# Patient Record
Sex: Male | Born: 1965 | Race: White | Hispanic: No | Marital: Married | Smoking: Never smoker
Health system: Southern US, Community
[De-identification: ages and names within clinical notes are randomized; demographics above are authoritative.]

## PROBLEM LIST (undated history)

## (undated) DIAGNOSIS — R9389 Abnormal findings on diagnostic imaging of other specified body structures: Secondary | ICD-10-CM

## (undated) DIAGNOSIS — G4733 Obstructive sleep apnea (adult) (pediatric): Secondary | ICD-10-CM

## (undated) HISTORY — DX: Obstructive sleep apnea (adult) (pediatric): G47.33

## (undated) HISTORY — DX: Abnormal findings on diagnostic imaging of other specified body structures: R93.89

---

## 1999-07-06 ENCOUNTER — Encounter (INDEPENDENT_AMBULATORY_CARE_PROVIDER_SITE_OTHER): Payer: Self-pay | Admitting: Specialist

## 1999-07-06 ENCOUNTER — Other Ambulatory Visit: Admission: RE | Admit: 1999-07-06 | Discharge: 1999-07-06 | Payer: Self-pay | Admitting: *Deleted

## 2000-08-10 ENCOUNTER — Encounter: Payer: Self-pay | Admitting: Pulmonary Disease

## 2000-10-28 ENCOUNTER — Encounter: Payer: Self-pay | Admitting: Pulmonary Disease

## 2000-10-28 ENCOUNTER — Ambulatory Visit (HOSPITAL_BASED_OUTPATIENT_CLINIC_OR_DEPARTMENT_OTHER): Admission: RE | Admit: 2000-10-28 | Discharge: 2000-10-28 | Payer: Self-pay | Admitting: Pulmonary Disease

## 2004-03-21 ENCOUNTER — Ambulatory Visit: Payer: Self-pay | Admitting: Pulmonary Disease

## 2004-04-21 ENCOUNTER — Ambulatory Visit: Payer: Self-pay | Admitting: Family Medicine

## 2004-09-01 ENCOUNTER — Ambulatory Visit: Payer: Self-pay | Admitting: Family Medicine

## 2005-04-24 ENCOUNTER — Ambulatory Visit: Payer: Self-pay | Admitting: Pulmonary Disease

## 2006-08-15 ENCOUNTER — Ambulatory Visit: Payer: Self-pay | Admitting: Pulmonary Disease

## 2007-07-03 ENCOUNTER — Encounter: Payer: Self-pay | Admitting: Pulmonary Disease

## 2007-07-10 DIAGNOSIS — Z9989 Dependence on other enabling machines and devices: Secondary | ICD-10-CM

## 2007-07-10 DIAGNOSIS — G4733 Obstructive sleep apnea (adult) (pediatric): Secondary | ICD-10-CM | POA: Insufficient documentation

## 2008-08-21 ENCOUNTER — Ambulatory Visit: Payer: Self-pay | Admitting: Pulmonary Disease

## 2009-06-23 ENCOUNTER — Encounter: Payer: Self-pay | Admitting: Pulmonary Disease

## 2009-08-24 ENCOUNTER — Encounter: Admission: RE | Admit: 2009-08-24 | Discharge: 2009-08-24 | Payer: Self-pay | Admitting: Cardiology

## 2009-08-24 ENCOUNTER — Encounter: Payer: Self-pay | Admitting: Internal Medicine

## 2009-08-25 ENCOUNTER — Ambulatory Visit: Payer: Self-pay | Admitting: Internal Medicine

## 2009-08-25 DIAGNOSIS — R930 Abnormal findings on diagnostic imaging of skull and head, not elsewhere classified: Secondary | ICD-10-CM

## 2009-08-25 DIAGNOSIS — R0609 Other forms of dyspnea: Secondary | ICD-10-CM | POA: Insufficient documentation

## 2009-08-25 DIAGNOSIS — R0989 Other specified symptoms and signs involving the circulatory and respiratory systems: Secondary | ICD-10-CM

## 2009-09-08 ENCOUNTER — Ambulatory Visit: Payer: Self-pay | Admitting: Internal Medicine

## 2009-09-08 ENCOUNTER — Encounter: Payer: Self-pay | Admitting: Internal Medicine

## 2009-10-22 ENCOUNTER — Ambulatory Visit: Payer: Self-pay | Admitting: Pulmonary Disease

## 2009-10-22 DIAGNOSIS — R05 Cough: Secondary | ICD-10-CM

## 2009-11-03 ENCOUNTER — Encounter: Payer: Self-pay | Admitting: Pulmonary Disease

## 2009-11-11 ENCOUNTER — Telehealth: Payer: Self-pay | Admitting: Internal Medicine

## 2009-11-18 ENCOUNTER — Telehealth (INDEPENDENT_AMBULATORY_CARE_PROVIDER_SITE_OTHER): Payer: Self-pay | Admitting: *Deleted

## 2009-11-23 ENCOUNTER — Encounter: Payer: Self-pay | Admitting: Internal Medicine

## 2009-11-24 ENCOUNTER — Telehealth (INDEPENDENT_AMBULATORY_CARE_PROVIDER_SITE_OTHER): Payer: Self-pay | Admitting: *Deleted

## 2009-12-07 ENCOUNTER — Encounter: Payer: Self-pay | Admitting: Internal Medicine

## 2009-12-08 ENCOUNTER — Ambulatory Visit: Payer: Self-pay | Admitting: Internal Medicine

## 2009-12-08 ENCOUNTER — Encounter: Payer: Self-pay | Admitting: Internal Medicine

## 2010-02-24 NOTE — Medication Information (Signed)
Summary: [facility]  Denial/medco   Imported By: Lester Berrydale 12/03/2009 08:09:00  _____________________________________________________________________  External Attachment:    Type:   Image     Comment:   External Document

## 2010-02-24 NOTE — Miscellaneous (Signed)
Summary: Orders Update  Clinical Lists Changes  Orders: Added new Test order of T-2 View CXR (71020TC) - Signed 

## 2010-02-24 NOTE — Progress Notes (Signed)
Summary: prescription for dexilant  Phone Note Call from Patient Call back at Home Phone [phone]   Caller: Patient Call For: clance Summary of Call: Wants a rx for dexilant 60mg .//[facility] Initial call taken by: Darletta Moll,  November 11, 2009 3:47 PM  Follow-up for Phone Call        pt was recently seen by [facility] 10/22/2009 and per instructions were to stop pepcid and start on dexilant two times a day.  pt was to f/u in 1 year for his OSA but to call in 2 weeks with an update on his cough.  [id number]  Aundra Millet Reynolds LPN  November 11, 2009 4:12 PM   called and spoke with pt.  pt states KC had given him samples of Dexilant to take two times a day for cough, gerd and throat clearing.  Pt states while he was on med his cough resolved and he denied any throat clearning and states gerd had improved.  pt states he has now ran out of samples and symptoms are returning.  Pt requests an rx be sent to his local pharmacy.  KC out of the office today.  Will forward message to doc of the day to address.  Cy, please advise if ok to send rx for Dexilant and if ok, please advise of Sig... do you still want him on two times a day or change to just once daily.  Thank you.  Aundra Millet Reynolds LPN  November 11, 2009 4:31 PM   Additional Follow-up for Phone Call Additional follow up Details #1::        Per CDY- ok to script #60 with 5 refills.Reynaldo Minium CMA  November 12, 2009 2:06 PM   Rx sent. pt aware.Carron Curie CMA  November 12, 2009 2:52 PM     Prescriptions: DEXILANT 60 MG CPDR (DEXLANSOPRAZOLE) Take  one 30-60 min before first meal of the day  #60 x 5   Entered by:   Carron Curie CMA   Authorized by:   Waymon Budge MD   Signed by:   Carron Curie CMA on 11/12/2009   Method used:   [facility]  [address] [phone][facility] (retail[facility]       [address]       [place], [place]  [address]       Ph: [phone] or [phone]       Fax: [phone]   RxID:    [id number]

## 2010-02-24 NOTE — Letter (Signed)
Summary: [facility]  [facility]   Imported By: Lester [place] 06/29/2009 11:57:45  _____________________________________________________________________  External Attachment:    Type:   Image     Comment:   External Document

## 2010-02-24 NOTE — Progress Notes (Signed)
Summary: f/u with cxr sched  ---- Converted from flag ---- ---- 09/08/2009 1:40 PM, Nyoka Cowden MD wrote: f/u cxr by now due ------------------------------  Spoke with pt and sched appt for 12/08/09 at 11:55 am Frank Boyle  November 25, 2009 2:57 PM

## 2010-02-24 NOTE — Assessment & Plan Note (Signed)
Summary: Pulmonary/  summary f/u ov for cough and f/u cxr = wnl   Copy to:  Dr. Julieanne Manson Primary Provider/Referring Provider:  Dr. Watt Climes  CC:  Cough- much improved.  History of Present Illness: 45 yowm never smoker with asthma as child reported by his mother (not recalled by pt)  variable sob  x 2003  August 25, 2009  1st pulmonary office eval  with variable sob sometimes at rest, assoc with freq throat clearing, sometimes can last up to a week and not necessarily affecting ability to do yardwork or swim or affect him once asleep and assoc with sensaiton needs to take a deep breath. started on asthma rx 8/2 and maybe some better but still having obvious resting sob in office with freq throat clearing and harsh barking dry cough.  imp ? vcd/ pseudoasthma rec  stop symbicort proaire up to 2 puffs if short of breath tramadol up to 2 every 4 hours for cough or throat clearing dexilant 60 mg Take  one 30-60 min before first meal of the day  pepcid 20 mg at bedtime taper off prednsione complete 09/06/09  September 08, 2009  ov overall night and day improved vs previous ov but still  throat clearing.  ok sleeping , walking, umpiring using proaire maybe once daily when heavy ex. rec treat gerd dexilant and pepcid  worked well until change to nexium not taking consistently 30-60 ac and stopped pepcid at hs when changed to nexium ? why?   December 08, 2009 cc cough better but still there p big meal and first thing in am. overall much better, no excess am or noct sputum production. Pt denies any significant sore throat, dysphagia, itching, sneezing,  nasal congestion or excess secretions,  fever, chills, sweats, unintended wt loss, pleuritic or exertional cp, hempoptysis, change in activity tolerance  orthopnea pnd or leg swelling Pt also denies any obvious fluctuation in symptoms with weather or environmental change or other alleviating or aggravating factors.       Current Medications  (verified): 1)  Potassium .... Take 1 Tablet By Mouth Once A Day 2)  Multivitamins  Caps (Multiple Vitamin) .... Take 1 Tablet By Mouth Once A Day 3)  Onglyza 5 Mg Tabs (Saxagliptin Hcl) .... Take 1 Tablet By Mouth Once A Day 4)  Metformin Hcl 500 Mg Tabs (Metformin Hcl) .... Take 1 Tablet By Mouth Two Times A Day 5)  Nabumetone 750 Mg Tabs (Nabumetone) .... Take 1 Tablet By Mouth Two Times A Day 6)  Zolpidem Tartrate 10 Mg Tabs (Zolpidem Tartrate) .Marland Kitchen.. 1 At Bedtime As Needed 7)  Proair Hfa 108 (90 Base) Mcg/act Aers (Albuterol Sulfate) .... 2 Puffs Every 4 Hours If Needed 8)  Nexium 40 Mg Cpdr (Esomeprazole Magnesium) .Marland Kitchen.. 1 By Mouth 30 To 60 Min Before First Meal of The Day. 9)  Pepcid Ac Maximum Strength 20 Mg Tabs (Famotidine) .... One At Bedtime- Stopped Taking  Allergies (verified): No Known Drug Allergies  Past History:  Past Medical History: OBSTRUCTIVE SLEEP APNEA (ICD-327.23).........................Marland KitchenClance Dyspea ? etiology         Unexplained sob vcd/ gerd vs panic disorder       - PFT's:  Nl spirometry September 08, 2009       - HFA 50 > 90% September 08, 2009  Abn CXR       - Mediastinal contour irregular 08/24/09 > not obvious on f/u cxr December 08, 2009   Vital Signs:  Patient profile:  45 year old male Weight:      290 pounds O2 Sat:      93 % on Room air Temp:     98.1 degrees F oral Pulse rate:   84 / minute BP sitting:   104 / 70  (left arm)  Vitals Entered By: Vernie Murders (December 08, 2009 11:53 AM)  O2 Flow:  Room air  Physical Exam  Additional Exam:  amb obese wm with much less  freq throat clearing  wt 305 > 297 August 25, 2009 > 303 September 08, 2009 > 290 December 08, 2009  HEENT: nl dentition, turbinates, and orophanx. Nl external ear canals without cough reflex Neck without JVD/Nodes/TM Lungs clear to A and P bilaterally without cough on insp or exp maneuvers RRR no s3 or murmur or increase in P2 Abd soft and benign with nl excursion in the supine  position. No bruits or organomegaly Ext warm without calf tenderness, cyanosis clubbing or edema Skin warm and dry without lesions      CXR  Procedure date:  12/08/2009  Findings:        Comparison: 08/24/2009   Findings: Trachea is midline.  Heart size normal.  Lungs are clear. No pleural fluid.  Prominent osteophytes are seen in the spine.   IMPRESSION: No acute findings.   Impression & Recommendations:  Problem # 1:  COUGH (ICD-786.2)   Classic Upper airway cough syndrome, so named because it's frequently impossible to sort out how much is  CR/sinusitis with freq throat clearing (which can be related to primary GERD)   vs  causing  secondary extra esophageal GERD from wide swings in gastric pressure that occur with throat clearing, promoting self use of mint and menthol lozenges that reduce the lower esophageal sphincter tone and exacerbate the problem further These are the same pts who not infrequently have failed to tolerate ace inhibitors,  dry powder inhalers or biphosphonates or report having reflux symptoms that don't respond to standard doses of PPI   For now continue max gerd rx  Orders: Est. Patient Level III (04540)  Problem # 2:  ABNORMAL LUNG XRAY (ICD-793.1)  I don't see any obvious med problem on pa and lateral and radiology read is also nl so no further xrays needed  Orders: Est. Patient Level III (98119)  Medications Added to Medication List This Visit: 1)  Pepcid Ac Maximum Strength 20 Mg Tabs (Famotidine) .... One at bedtime 2)  Pepcid Ac Maximum Strength 20 Mg Tabs (Famotidine) .... One at bedtime- stopped taking  Patient Instructions: 1)  Keep follow up with Dr Shelle Iron unless the cough is still bothering you on the  Nexium Take  one 30-60 min before first meal of the day and the pepcid 20 mg one at bedtime 2)  we will call with your xray report

## 2010-02-24 NOTE — Assessment & Plan Note (Signed)
Summary: 2 weeks/ mbw   Current Medications (verified): 1)  Potassium .... Take 1 Tablet By Mouth Once A Day 2)  Multivitamins  Caps (Multiple Vitamin) .... Take 1 Tablet By Mouth Once A Day 3)  Onglyza 5 Mg Tabs (Saxagliptin Hcl) .... Take 1 Tablet By Mouth Once A Day 4)  Metformin Hcl 500 Mg Tabs (Metformin Hcl) .... Take 1 Tablet By Mouth Two Times A Day 5)  Nabumetone 750 Mg Tabs (Nabumetone) .... Take 1 Tablet By Mouth Two Times A Day 6)  Zolpidem Tartrate 10 Mg Tabs (Zolpidem Tartrate) .Marland Kitchen.. 1 At Bedtime As Needed 7)  Proair Hfa 108 (90 Base) Mcg/act Aers (Albuterol Sulfate) .... 2 Puffs Every 4 Hours If Needed 8)  Dexilant 60 Mg Cpdr (Dexlansoprazole) .... Take  One 30-60 Min Before First Meal of The Day 9)  Pepcid Ac Maximum Strength 20 Mg Tabs (Famotidine) .... One At Bedtime  Allergies (verified): No Known Drug Allergies  Vital Signs:  Patient profile:   45 year old male Height:      182.88 cm Weight:      0.50 pounds (137.95 kg) BMI:     41.31 Temp:     36.94 degrees C   THIS WAS PUT IN ON THE WRONG VISIT # DUE TO THE DOCTORS CHANGING THERE MINDS ON WHO WAS SEEING THE PATIENT SEE OTHER NOTE FOR CHARGE AND FULL VISIT.Marland KitchenChantel Bowne  September 08, 2009 12:26 PM

## 2010-02-24 NOTE — Miscellaneous (Signed)
  Clinical Lists Changes 

## 2010-02-24 NOTE — Assessment & Plan Note (Signed)
Summary: rov for osa, chronic cough   Copy to:  Dr. Julieanne Manson Primary Provider/Referring Provider:  Dr. Watt Climes  CC:  4 week follow up. pt states he currently uses his cpap 7/7 nights x 6-7 hrs a night. Pt states he is having some problems with his mask and pt states he can't get in touch with anyone form his homecare company. .  History of Present Illness: the pt comes in today for f/u of his osa, but is also having acute issues with persistent cough.  The pt is due for a mask, but has been unable to get in touch with his dme.  He is unhappy with them as well since they never got a download off his machine while on auto, and he has stayed on that mode.  He would prefer to be on a set pressure.  The pt also has been having issues with upper airway cough, and was recently seen by Dr. Sherene Sires.  He has been trying behavioral therapy, but continues to have his dry cough.  He has been doing a lot of throat clearing, and has a classic globus sensation.  He is having burping per his wife, and breakthru reflux symptoms despite dexilant and pepcid at hs.  He is convinced that food is getting stuck in his throat.  Current Medications (verified): 1)  Potassium .... Take 1 Tablet By Mouth Once A Day 2)  Multivitamins  Caps (Multiple Vitamin) .... Take 1 Tablet By Mouth Once A Day 3)  Onglyza 5 Mg Tabs (Saxagliptin Hcl) .... Take 1 Tablet By Mouth Once A Day 4)  Metformin Hcl 500 Mg Tabs (Metformin Hcl) .... Take 1 Tablet By Mouth Two Times A Day 5)  Nabumetone 750 Mg Tabs (Nabumetone) .... Take 1 Tablet By Mouth Two Times A Day 6)  Zolpidem Tartrate 10 Mg Tabs (Zolpidem Tartrate) .Marland Kitchen.. 1 At Bedtime As Needed 7)  Proair Hfa 108 (90 Base) Mcg/act Aers (Albuterol Sulfate) .... 2 Puffs Every 4 Hours If Needed 8)  Dexilant 60 Mg Cpdr (Dexlansoprazole) .... Take  One 30-60 Min Before First Meal of The Day 9)  Pepcid Ac Maximum Strength 20 Mg Tabs (Famotidine) .... One At Bedtime  Allergies (verified): No Known  Drug Allergies  Past History:  Past medical, surgical, family and social histories (including risk factors) reviewed, and no changes noted (except as noted below).  Past Medical History: Reviewed history from 09/08/2009 and no changes required. OBSTRUCTIVE SLEEP APNEA (ICD-327.23).........................Marland KitchenClance Dyspea ? etiology         Unexplained sob vcd/ gerd vs panic disorder       - PFT's:  Nl spirometry September 08, 2009       - HFA 50 > 90% September 08, 2009  Abn CXR       - Mediastinal contour irregular 08/24/09  Family History: Reviewed history from 08/25/2009 and no changes required. heart disease: father Negative for respiratory diseases or atopy   Social History: Reviewed history from 08/25/2009 and no changes required. Patient never smoked.  Pt works as an Personnel officer. Married Children No ETOH  Review of Systems       The patient complains of shortness of breath with activity, non-productive cough, difficulty swallowing, hand/feet swelling, and joint stiffness or pain.  The patient denies shortness of breath at rest, productive cough, coughing up blood, chest pain, irregular heartbeats, acid heartburn, indigestion, loss of appetite, weight change, abdominal pain, sore throat, tooth/dental problems, headaches, nasal congestion/difficulty breathing through nose, sneezing, itching, ear  ache, anxiety, depression, rash, change in color of mucus, and fever.    Vital Signs:  Patient profile:   45 year old male Height:      72 inches Weight:      295.25 pounds BMI:     40.19 O2 Sat:      94 % on Room air Temp:     99.0 degrees F oral Pulse rate:   93 / minute BP sitting:   150 / 88  (right arm) Cuff size:   large  Vitals Entered By: Carver Fila (October 22, 2009 3:45 PM)  O2 Flow:  Room air CC: 4 week follow up. pt states he currently uses his cpap 7/7 nights x 6-7 hrs a night. Pt states he is having some problems with his mask and pt states he can't get in touch  with anyone form his homecare company.  Comments meds and allergies updated Phone number updated Carver Fila  October 22, 2009 3:45 PM    Physical Exam  General:  obese male in nad Nose:  no skin breakdown or pressure necrosis from cpap mask Lungs:  clear to auscultation Heart:  rrr, no mrg Extremities:  no edema or cyanosis  Neurologic:  alert, does not appear sleepy, moves all 4.   Impression & Recommendations:  Problem # 1:  OBSTRUCTIVE SLEEP APNEA (ICD-327.23) the pt has osa that is being treated with cpap.  He is doing well with the new machine, but needs a new mask and his machine set on a fixed pressure.  Will refer him to a different dme, and get a download off his machine to set his pressure.  Will also get new supplies and mask.  I have encouraged him to work aggressively on weight loss.  Problem # 2:  COUGH (ICD-786.2) the pt's cough is most c/w an upper airway issue, and most likely is due to GERD given his GI symptoms and a cyclical cough mechanism with constant throat clearing.  I would like to double up on his ppi, and have asked him to stay on the behavioral therapies.  He may need a GI evaluation if symptoms continue.  Other Orders: Est. Patient Level IV (98119) DME Referral (DME)  Patient Instructions: 1)  stop pepcid.  take dexilant in am and pm 2)  take chlorpheniramine 8mg  at bedtime each night for next 2 weeks. 3)  stop proair. 4)  no singing, yelling, minimize voice use, no throat clearing. 5)  will get download off your machine and change to set pressure 6)  will arrange for new mask 7)  work on weight loss 8)  followup with me in one year for your sleep apnea, but call me in 2 weeks with progress on your cough   Immunization History:  Influenza Immunization History:    Influenza:  historical (10/23/2008)   Appended Document: rov for osa, chronic cough re have received cpap download and put into you important lok at papers  Appended Document:  rov for osa, chronic cough cpap pressure needs to be 15cm.  will send an order to [facility].

## 2010-02-24 NOTE — Progress Notes (Signed)
Summary: Dexilant PA initiated  Phone Note Outgoing Call   Call placed by: Michel Bickers CMA,  November 18, 2009 10:53 AM Call placed to: [facility] [phone] Summary of Call: PA initiated for Dexilant 60MG . Case # is [id number]. Awaiting fax. Initial call taken by: Michel Bickers CMA,  November 18, 2009 10:57 AM  Follow-up for Phone Call        Form received and placed in MW's to do pile.   Gweneth Dimitri RN  November 18, 2009 4:41 PM  Dexilant has been denied. Please advise on an alternative.Carron Curie CMA  November 25, 2009 12:06 PM  Per MW okay to switch to nexium- I already had faxed this to pharm last wk, it must have not went through so I sent it to pharm. Follow-up by: Vernie Murders,  November 25, 2009 2:59 PM    New/Updated Medications: NEXIUM 40 MG CPDR (ESOMEPRAZOLE MAGNESIUM) 1 by mouth 30 to 60 min before first meal of the day. Prescriptions: NEXIUM 40 MG CPDR (ESOMEPRAZOLE MAGNESIUM) 1 by mouth 30 to 60 min before first meal of the day.  #30 x 5   Entered by:   Vernie Murders   Authorized by:   Nyoka Cowden MD   Signed by:   Vernie Murders on 11/25/2009   Method used:   [facility]  [address] [phone]* (retail[facility]       [address]       [place], [place]  [address]       Ph: [phone] or [phone]       Fax: [phone]   RxID:   [id number]

## 2010-02-24 NOTE — Assessment & Plan Note (Signed)
Summary: Pulmonary/ ext ov with hfa teaching at 90 % p coaching   Visit Type:  Follow-up Copy to:  Dr. Julieanne Manson Primary Provider/Referring Provider:  Dr. Watt Climes  CC:  Patient here for 2 week follow-up.  The patient says his cough and sob have improved.Marland Kitchen  History of Present Illness: 1 yowm never smoker with asthma as child reported by his mother (not recalled by pt)  variable sob  x 2003  August 25, 2009  1st pulmonary office eval  with variable sob sometimes at rest, assoc with freq throat clearing, sometimes can last up to a week and not necessarily affecting ability to do yardwork or swim or affect him once asleep and assoc with sensaiton needs to take a deep breath. started on asthma rx 8/2 and maybe some better but still having obvious resting sob in office with freq throat clearing and harsh barking dry cough.  imp ? vcd/ pseudoasthma rec  stop symbicort proaire up to 2 puffs if short of breath tramadol up to 2 every 4 hours for cough or throat clearing dexilant 60 mg Take  one 30-60 min before first meal of the day  pepcid 20 mg at bedtime taper off prednsione complete 09/06/09  September 08, 2009  ov overall night and day improved vs previous ov but still  throat clearing.  ok sleeping , walking, umpiring using proaire maybe once daily when heavy ex. Pt denies any significant sore throat, dysphagia, itching, sneezing,  nasal congestion or excess secretions,  fever, chills, sweats, unintended wt loss, pleuritic or exertional cp, hempoptysis, change in activity tolerance  orthopnea pnd or leg swelling.  Pt also denies any obvious fluctuation in symptoms with weather or environmental change or other alleviating or aggravating factors.     Pt denies any  rescue therapy x one week, denies waking up needing it or having early am exacerbations of coughing/wheezing/ or dyspnea.       Current Medications (verified): 1)  Potassium .... Take 1 Tablet By Mouth Once A Day 2)  Multivitamins   Caps (Multiple Vitamin) .... Take 1 Tablet By Mouth Once A Day 3)  Onglyza 5 Mg Tabs (Saxagliptin Hcl) .... Take 1 Tablet By Mouth Once A Day 4)  Metformin Hcl 500 Mg Tabs (Metformin Hcl) .... Take 1 Tablet By Mouth Two Times A Day 5)  Nabumetone 750 Mg Tabs (Nabumetone) .... Take 1 Tablet By Mouth Two Times A Day 6)  Tramadol Hcl 50 Mg Tabs (Tramadol Hcl) .Marland Kitchen.. 1-2 Every 4 Hours 7)  Zolpidem Tartrate 10 Mg Tabs (Zolpidem Tartrate) .Marland Kitchen.. 1 At Bedtime As Needed 8)  Proair Hfa 108 (90 Base) Mcg/act Aers (Albuterol Sulfate) .... 2 Puffs Every 4 Hours If Needed 9)  Prednisone 10 Mg Tabs (Prednisone) .... Tapered Dose As Directed 10)  Dexilant 60 Mg Cpdr (Dexlansoprazole) .... Take  One 30-60 Min Before First Meal of The Day 11)  Pepcid Ac Maximum Strength 20 Mg Tabs (Famotidine) .... One At Bedtime  Allergies (verified): No Known Drug Allergies  Past History:  Past Medical History: OBSTRUCTIVE SLEEP APNEA (ICD-327.23).........................Marland KitchenClance Dyspea ? etiology         Unexplained sob vcd/ gerd vs panic disorder       - PFT's:  Nl spirometry September 08, 2009       - HFA 50 > 90% September 08, 2009  Abn CXR       - Mediastinal contour irregular 08/24/09  Vital Signs:  Patient profile:   45 year  old male Height:      72 inches (182.88 cm) Weight:      303.50 pounds (137.95 kg) BMI:     41.31 O2 Sat:      97 % on Room air Temp:     98 degrees F (36.67 degrees C) oral Pulse rate:   86 / minute BP sitting:   120 / 80  (left arm) Cuff size:   large  Vitals Entered By: Michel Bickers CMA (September 08, 2009 12:04 PM)  O2 Sat at Rest %:  97 O2 Flow:  Room air CC: Patient here for 2 week follow-up.  The patient says his cough and sob have improved. Comments Medications reviewed with the patient. Daytime phone verified. Michel Bickers [facility]  September 08, 2009 12:05 PM   Physical Exam  Additional Exam:  amb obese wm with freq throat clearing and obvious sigh breathing wt 305 > 297 August 25, 2009  > 303 September 08, 2009  HEENT: nl dentition, turbinates, and orophanx. Nl external ear canals without cough reflex Neck without JVD/Nodes/TM Lungs clear to A and P bilaterally without cough on insp or exp maneuvers RRR no s3 or murmur or increase in P2 Abd soft and benign with nl excursion in the supine position. No bruits or organomegaly Ext warm without calf tenderness, cyanosis clubbing or edema Skin warm and dry without lesions      Pulmonary Function Test Date: 09/08/2009 12:01 AM Gender: Male  Pre-Spirometry FVC    Value: 4.50 L/min   % Pred: 82.30 % FEV1    Value: 3.24 L     Pred: 4.29 L     % Pred: 75.50 % FEV1/FVC  Value: 72.01 %     % Pred: 91.40 %  Impression & Recommendations:  Problem # 1:  DYSPNEA (ICD-786.09)  His updated medication list for this problem includes:    Proair Hfa 108 (90 Base) Mcg/act Aers (Albuterol sulfate) .Marland Kitchen... 2 puffs every 4 hours if needed  If has asthma at all, All goals of asthma met including optimal function and elimination of symptoms with minimum need for rescue therapy. Contingencies discussed today including the rule of two's.   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 These are the same pts who not infrequently have failed to tolerate ace inhibitors,  dry powder inhalers or biphosphonates or report having reflux symptoms that don't respond to standard doses of PPI   For now continue max gerd rx and eliminate cycylical coughing wtih tramadol  Orders: Est. Patient Level IV (08657) Spirometry w/Graph (94010)  Problem # 2:  ABNORMAL LUNG  XRAY (ICD-793.1)  Placed in tickle file for f/u cxr 11/23/2009 but consider CT chest in meantime if atypical symptoms continue  no baseline cxr in [facility] system ? old films anywhere?  Patient Instructions: 1)  If your breathing worsens or you need to use your rescue inhaler more than twice weekly or wake up more than twice a month with any respiratory symptoms or require more than two rescue inhalers per year, we need to see you right away. 2)  Take  delsym two tsp every 12 hours and add tramadol 50 mg up to every 4 hours to suppress the urge to cough. Swallowing water or using ice chips/non mint and menthol containing candies (such as lifesavers or sugarless jolly ranchers) are also effective.  3)  GERD (REFLUX)  is a common cause of respiratory symptoms. It commonly presents without heartburn and can be treated with medication, but also with lifestyle changes including avoidance of late meals, excessive alcohol, smoking cessation, and avoid fatty foods, chocolate, peppermint, colas, red wine, and acidic juices such as orange juice. NO MINT OR MENTHOL PRODUCTS SO NO COUGH DROPS  4)  USE SUGARLESS CANDY INSTEAD (jolley ranchers)  5)  NO OIL BASED VITAMINS  6)  Please schedule a follow-up appointment in 4 weeks with Dr Shelle Iron   [facility] Spirometry  ID: [id number] Patient: Frank Boyle, Frank Boyle DOB: 03/11/1965 Age: 45 Years Old Sex: Male Race: White Height: 72 Weight: 303.50 Status: Unconfirmed Past Medical History:    OBSTRUCTIVE SLEEP APNEA (ICD-327.23) Unexplained sob vcd/ gerd vs panic disorder  Recorded: 09/08/2009 12:01 AM  Parameter  Measured Predicted %Predicted FVC     4.50        5.47        82.30 FEV1     3.24        4.29        75.50 FEV1%   72.01        78.77        91.40 PEF    5.31        10.41        51   Interpretation:

## 2010-02-24 NOTE — Miscellaneous (Signed)
Summary: optimal pressure 15cm.  Clinical Lists Changes  Orders: Added new Referral order of DME Referral (DME) - Signed

## 2010-02-24 NOTE — Letter (Signed)
Summary: [employer]  [address]   [place], [place] [address]   Phone: [phone]  Fax: [phone]    Today's Date: December 08, 2009  Name of Patient: Frank Boyle  The above named patient had a medical visit today at: 11:55  am.  Please take this into consideration when reviewing the time away from work/school.    Special Instructions:  [ x ] None  [  ] To be off the remainder of today, returning to the normal work / school schedule tomorrow.  [  ] To be off until the next scheduled appointment on ______________________.  [  ] Other ________________________________________________________________ ________________________________________________________________________   Sincerely yours,     Sandrea Hughs, MD

## 2010-02-24 NOTE — Assessment & Plan Note (Signed)
Summary: Pulmonary consultation/ eval cough and sob ? all GERD?   Visit Type:  Initial Consult Copy to:  Dr. Julieanne Manson Primary Provider/Referring Provider:  Dr. Watt Climes  CC:  Dyspnea.  History of Present Illness: 69 yowm never smoker with asthma as child reported by his mother (not recalled by pt)  variable sob  x 2003  August 25, 2009  1st pulmonary office eval  with variable sob sometimes at rest, assoc with freq throat clearing, sometimes can last up to a week and not necessarily affecting ability to do yardwork or swim or affect him once asleep and assoc with sensaiton needs to take a deep breath. started on asthma rx 8/2 and maybe some better but still having obvious resting sob in office with freq throat clearing and harsh barking dry cough.  Pt denies any significant sore throat, dysphagia, itching, sneezing,  nasal congestion or excess secretions,  fever, chills, sweats, unintended wt loss, pleuritic or exertional cp, hempoptysis, change in activity tolerance  orthopnea pnd or leg swelling.  h/o hb ok on ppi.  Pt also denies any obvious fluctuation in symptoms with weather or environmental change or other alleviating or aggravating factors.      Current Medications (verified): 1)  Potassium .... Take 1 Tablet By Mouth Once A Day 2)  Multivitamins  Caps (Multiple Vitamin) .... Take 1 Tablet By Mouth Once A Day 3)  Onglyza 5 Mg Tabs (Saxagliptin Hcl) .... Take 1 Tablet By Mouth Once A Day 4)  Metformin Hcl 500 Mg Tabs (Metformin Hcl) .... Take 1 Tablet By Mouth Two Times A Day 5)  Nabumetone 750 Mg Tabs (Nabumetone) .... Take 1 Tablet By Mouth Two Times A Day 6)  Tramadol Hcl 50 Mg Tabs (Tramadol Hcl) .... Take By Mouth As Needed 7)  Symbicort 160-4.5 Mcg/act Aero (Budesonide-Formoterol Fumarate) .Marland Kitchen.. 1 Puffs Two Times A Day 8)  Zolpidem Tartrate 10 Mg Tabs (Zolpidem Tartrate) .Marland Kitchen.. 1 At Bedtime As Needed 9)  Proair Hfa 108 (90 Base) Mcg/act Aers (Albuterol Sulfate) .... 2 Puffs  3 Times A Day 10)  Prednisone 10 Mg Tabs (Prednisone) .... Tapered Dose As Directed  Allergies (verified): No Known Drug Allergies  Past History:  Past Medical History:   OBSTRUCTIVE SLEEP APNEA (ICD-327.23) Unexplained sob vcd/ gerd vs panic disorder  Family History: heart disease: father Negative for respiratory diseases or atopy   Social History: Patient never smoked.  Pt works as an Personnel officer. Married Children No ETOH  Review of Systems       The patient complains of shortness of breath with activity, shortness of breath at rest, non-productive cough, chest pain, acid heartburn, and indigestion.  The patient denies productive cough, coughing up blood, irregular heartbeats, loss of appetite, weight change, abdominal pain, difficulty swallowing, sore throat, tooth/dental problems, headaches, nasal congestion/difficulty breathing through nose, sneezing, itching, ear ache, anxiety, depression, hand/feet swelling, joint stiffness or pain, rash, change in color of mucus, and fever.    Vital Signs:  Patient profile:   45 year old male Weight:      297.50 pounds BMI:     40.49 O2 Sat:      96 % on Room air Temp:     98.7 degrees F oral Pulse rate:   110 / minute BP sitting:   146 / 88  (left arm) Cuff size:   large  Vitals Entered By: Vernie Murders (August 25, 2009 1:53 PM)  O2 Flow:  Room air  Physical Exam  Additional Exam:  amb obese wm with freq throat clearing and obvious sigh breathing wt 305 > 297 August 25, 2009  HEENT: nl dentition, turbinates, and orophanx. Nl external ear canals without cough reflex Neck without JVD/Nodes/TM Lungs clear to A and P bilaterally without cough on insp or exp maneuvers RRR no s3 or murmur or increase in P2 Abd soft and benign with nl excursion in the supine position. No bruits or organomegaly Ext warm without calf tenderness, cyanosis clubbing or edema Skin warm and dry without lesions      CXR  Procedure date:   08/24/2009  Findings:      slt abn contour of d aorta, lungs clear  Impression & Recommendations:  Problem # 1:  DYSPNEA (ICD-786.09)   DDX of  difficult airways managment all start with A and  include Adherence, Ace Inhibitors, Acid Reflux, Active Sinus Disease, Alpha 1 Antitripsin deficiency, Anxiety masquerading as Airways dz,  ABPA,  allergy(esp in young), Aspiration (esp in elderly), Adverse effects of DPI,  Active smokers, plus one B  = Beta blocker use..    Freq throat clearing and obvious sigh breathing suggestive of vcd/gerd  or panic disorder.  rec max rx of gerd first and f/u [facility] or if pt/ Dr Shelle Iron would like I can see him back here focusing on his upper airway complaints.   Each maintenance medication was reviewed in detail including most importantly the difference between maintenance and as needed and under what circumstances the prns are to be used. See instructions for specific recommendations   Orders: Consultation Level V (41324)  Problem # 2:  ABNORMAL LUNG XRAY (ICD-793.1)  unusual contour to d aorta probably longterm but no baseline cxr in system.  very likely benign and nothing to do with his present symptoms  Orders: Consultation Level V (40102)  Problem # 3:  OBSTRUCTIVE SLEEP APNEA (ICD-327.23) Fu Clance already planned  Medications Added to Medication List This Visit: 1)  Tramadol Hcl 50 Mg Tabs (Tramadol hcl) .Marland Kitchen.. 1-2 every 4 hours 2)  Symbicort 160-4.5 Mcg/act Aero (Budesonide-formoterol fumarate) .Marland Kitchen.. 1 puffs two times a day 3)  Zolpidem Tartrate 10 Mg Tabs (Zolpidem tartrate) .Marland Kitchen.. 1 at bedtime as needed 4)  Proair Hfa 108 (90 Base) Mcg/act Aers (Albuterol sulfate) .... 2 puffs 3 times a day 5)  Proair Hfa 108 (90 Base) Mcg/act Aers (Albuterol sulfate) .... 2 puffs every 4 hours if needed 6)  Prednisone 10 Mg Tabs (Prednisone) .... Tapered dose as directed 7)  Dexilant 60 Mg Cpdr (Dexlansoprazole) .... Take  one 30-60 min before first meal of the  day 8)  Pepcid Ac Maximum Strength 20 Mg Tabs (Famotidine) .... One at bedtime  Patient Instructions: 1)  stop symbicort 2)  proaire up to 2 puffs if short of breath 3)  tramadol up to 2 every 4 hours for cough or throat clearing 4)  dexilant 60 mg Take  one 30-60 min before first meal of the day  5)  pepcid 20 mg at bedtime 6)  taper off prednsione 7)  GERD (REFLUX)  is a common cause of respiratory symptoms. It commonly presents without heartburn and can be treated with medication, but also with lifestyle changes including avoidance of late meals, excessive alcohol, smoking cessation, and avoid fatty foods, chocolate, peppermint, colas, red wine, and acidic juices such as orange juice. NO MINT OR MENTHOL PRODUCTS SO NO COUGH DROPS  8)  USE SUGARLESS CANDY INSTEAD (jolley ranchers)  9)  NO OIL  BASED VITAMINS  10)  Please schedule a follow-up appointment in 2 weeks, sooner if needed to see Dr Shelle Iron  Prescriptions: TRAMADOL HCL 50 MG TABS (TRAMADOL HCL) 1-2 every 4 hours  #40 x 0   Entered and Authorized by:   Nyoka Cowden MD   Signed by:   Nyoka Cowden MD on 08/25/2009   Method used:   Electronically to        [facility]  [address] [phone]* (retail)       [address]       [place], [place]  [address]       Ph: [phone] or [phone]       Fax: [phone]   RxID:   [id number]

## 2010-02-25 ENCOUNTER — Telehealth: Payer: Self-pay | Admitting: Pulmonary Disease

## 2010-02-25 ENCOUNTER — Encounter: Payer: Self-pay | Admitting: Internal Medicine

## 2010-03-10 NOTE — Progress Notes (Signed)
Summary: PA for nexium approved  Phone Note Outgoing Call   Call placed by: Michel Bickers CMA,  February 25, 2010 8:54 AM Call placed to: [facility] [phone] Summary of Call: PA initiated for Nexium.  No alternatives.  Member ID # is [id number].  Awaiting fax.  Michel Bickers [facility]  February 25, 2010 8:56 AM  Follow-up for Phone Call        PA was filled out and faxed back to [facility].  Form was placed in triage under awaiting approval. Follow-up by: Vernie Murders,  February 25, 2010 10:26 AM  Additional Follow-up for Phone Call Additional follow up Details #1::        Nexium was approved from 02-04-10 until 02-25-11. Pharmacy and pt aware. Carron Curie CMA  March 02, 2010 9:42 AM

## 2010-03-16 NOTE — Medication Information (Signed)
Summary: Approval/medco  Approval/medco   Imported By: Lester Onycha 03/09/2010 07:32:31  _____________________________________________________________________  External Attachment:    Type:   Image     Comment:   External Document

## 2010-06-20 ENCOUNTER — Other Ambulatory Visit: Payer: Self-pay | Admitting: Internal Medicine

## 2010-07-22 ENCOUNTER — Other Ambulatory Visit: Payer: Self-pay | Admitting: Internal Medicine

## 2010-08-09 ENCOUNTER — Other Ambulatory Visit: Payer: Self-pay | Admitting: Internal Medicine

## 2011-06-15 ENCOUNTER — Encounter: Payer: Self-pay | Admitting: Pulmonary Disease

## 2011-06-15 ENCOUNTER — Encounter: Payer: Self-pay | Admitting: *Deleted

## 2011-06-16 ENCOUNTER — Encounter: Payer: Self-pay | Admitting: Pulmonary Disease

## 2011-06-16 ENCOUNTER — Ambulatory Visit (INDEPENDENT_AMBULATORY_CARE_PROVIDER_SITE_OTHER): Payer: BC Managed Care – PPO | Admitting: Pulmonary Disease

## 2011-06-16 VITALS — BP 118/72 | HR 87 | Temp 98.6°F | Ht 72.0 in | Wt 298.8 lb

## 2011-06-16 DIAGNOSIS — G4733 Obstructive sleep apnea (adult) (pediatric): Secondary | ICD-10-CM

## 2011-06-16 NOTE — Patient Instructions (Signed)
Will get a download off your current machine to check on control of your sleep apnea, and to evaluate for mask leaks.  If everything appears to be in working order, will convert you over to a fixed pressure.  If your sleep issues continue, more than likely is due to something other than your sleep apnea Work on weight loss If doing well, followup with me in one year.

## 2011-06-16 NOTE — Progress Notes (Signed)
  Subjective:    Patient ID: Frank Boyle, male    DOB: 11-18-1965, 46 y.o.   MRN: [id number]  HPI Patient comes in today for followup of his known obstructive sleep apnea.  He has not been seen in almost 2 years, and is having difficulties with his sleep at night.  He has become more restless, and is fatigued during the day.  He reports that he wears his CPAP compliantly on the automatic setting, and for some reason he never had the machine changed over to a set pressure.  He is having no significant mask leak issues, and has been keeping up with his mask changes.  I've asked him to think about his various environmental issues in his bedroom to see if that may be contributing to his worsening sleep.   Review of Systems  Constitutional: Negative for fever and unexpected weight change.  HENT: Negative for ear pain, nosebleeds, congestion, sore throat, rhinorrhea, sneezing, trouble swallowing, dental problem, postnasal drip and sinus pressure.   Eyes: Negative for redness and itching.  Respiratory: Positive for cough. Negative for chest tightness, shortness of breath and wheezing.   Cardiovascular: Negative for palpitations and leg swelling.  Gastrointestinal: Negative for nausea and vomiting.  Genitourinary: Negative for dysuria.  Musculoskeletal: Negative for joint swelling.  Skin: Negative for rash.  Neurological: Negative for headaches.  Hematological: Does not bruise/bleed easily.  Psychiatric/Behavioral: Negative for dysphoric mood. The patient is not nervous/anxious.        Objective:   Physical Exam Obese male in no acute distress Nose without purulence or discharge noted No skin breakdown or pressure necrosis from the CPAP mask Lower extremities with no significant edema, no cyanosis Alert, does not appear to be a very sleepy, moves all 4 extremities.       Assessment & Plan:

## 2011-06-16 NOTE — Assessment & Plan Note (Signed)
The patient has a history of very severe obstructive sleep apnea, but has done well in the past with CPAP.  He is currently on an autoset device, and tells me that he is wearing it compliantly.  His machine is not that old, and he reports that he has kept up with mask changes.  He is not having significant leaks.  At this point, I would like to get a download of his current machine to make sure that he is not having any high leak.  Will also look at compliance and whether we are controlling his AHI.  I've also asked him to consider his sleeping environment, and whether stress and anxiety may be playing a role in some of his symptoms.  Will convert the patient over to a set pressure and see how he responds.  I also encouraged him to work aggressively on weight loss.

## 2011-07-08 ENCOUNTER — Other Ambulatory Visit: Payer: Self-pay | Admitting: Pulmonary Disease

## 2011-07-08 DIAGNOSIS — G4733 Obstructive sleep apnea (adult) (pediatric): Secondary | ICD-10-CM

## 2012-01-30 IMAGING — CR DG CHEST 2V
2 series · 2 of 2 positions shown · non-contrast
Comparison: None.

CLINICAL DATA: Cough, shortness of breath

CHEST - 2 VIEW

[w chest pa]
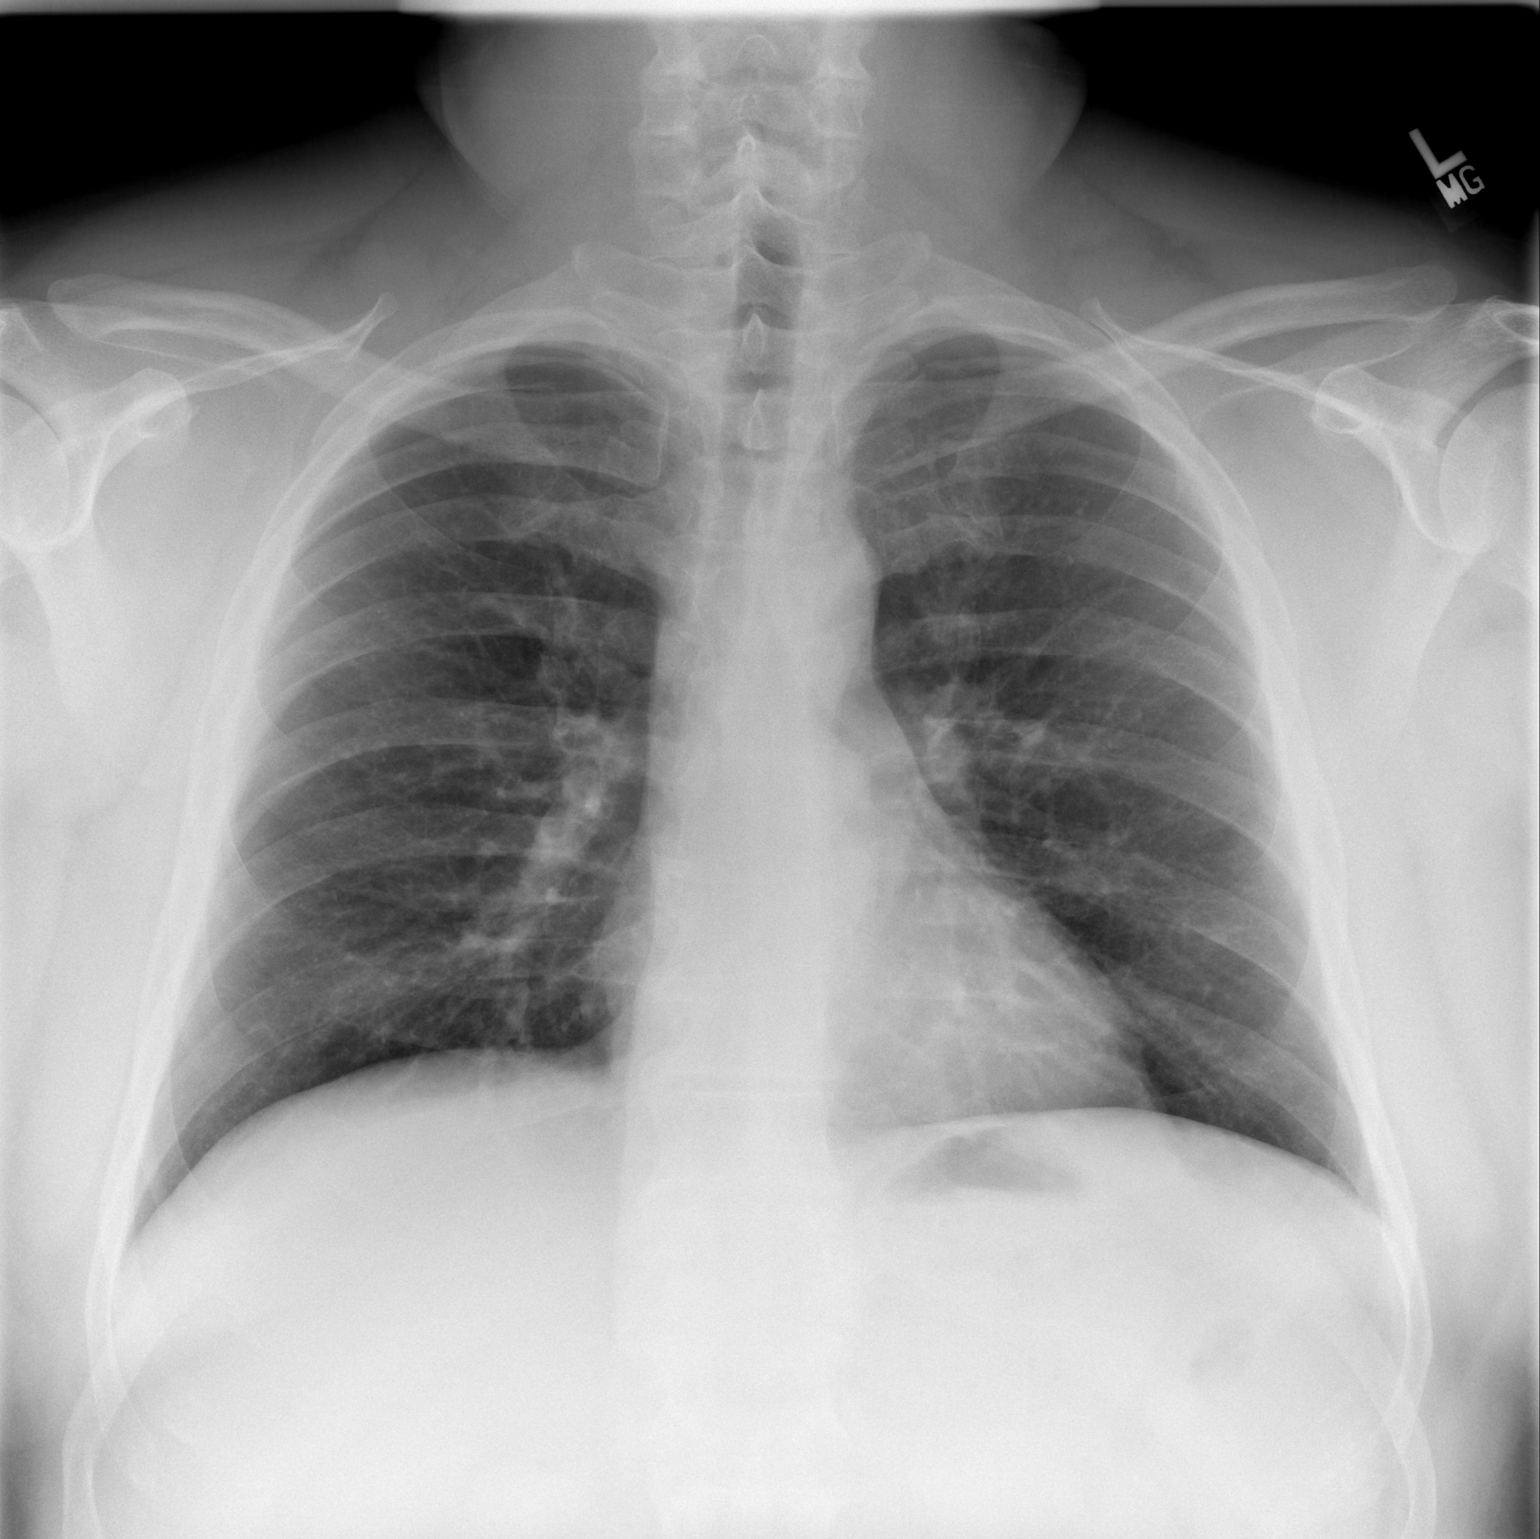

[w chest lat]
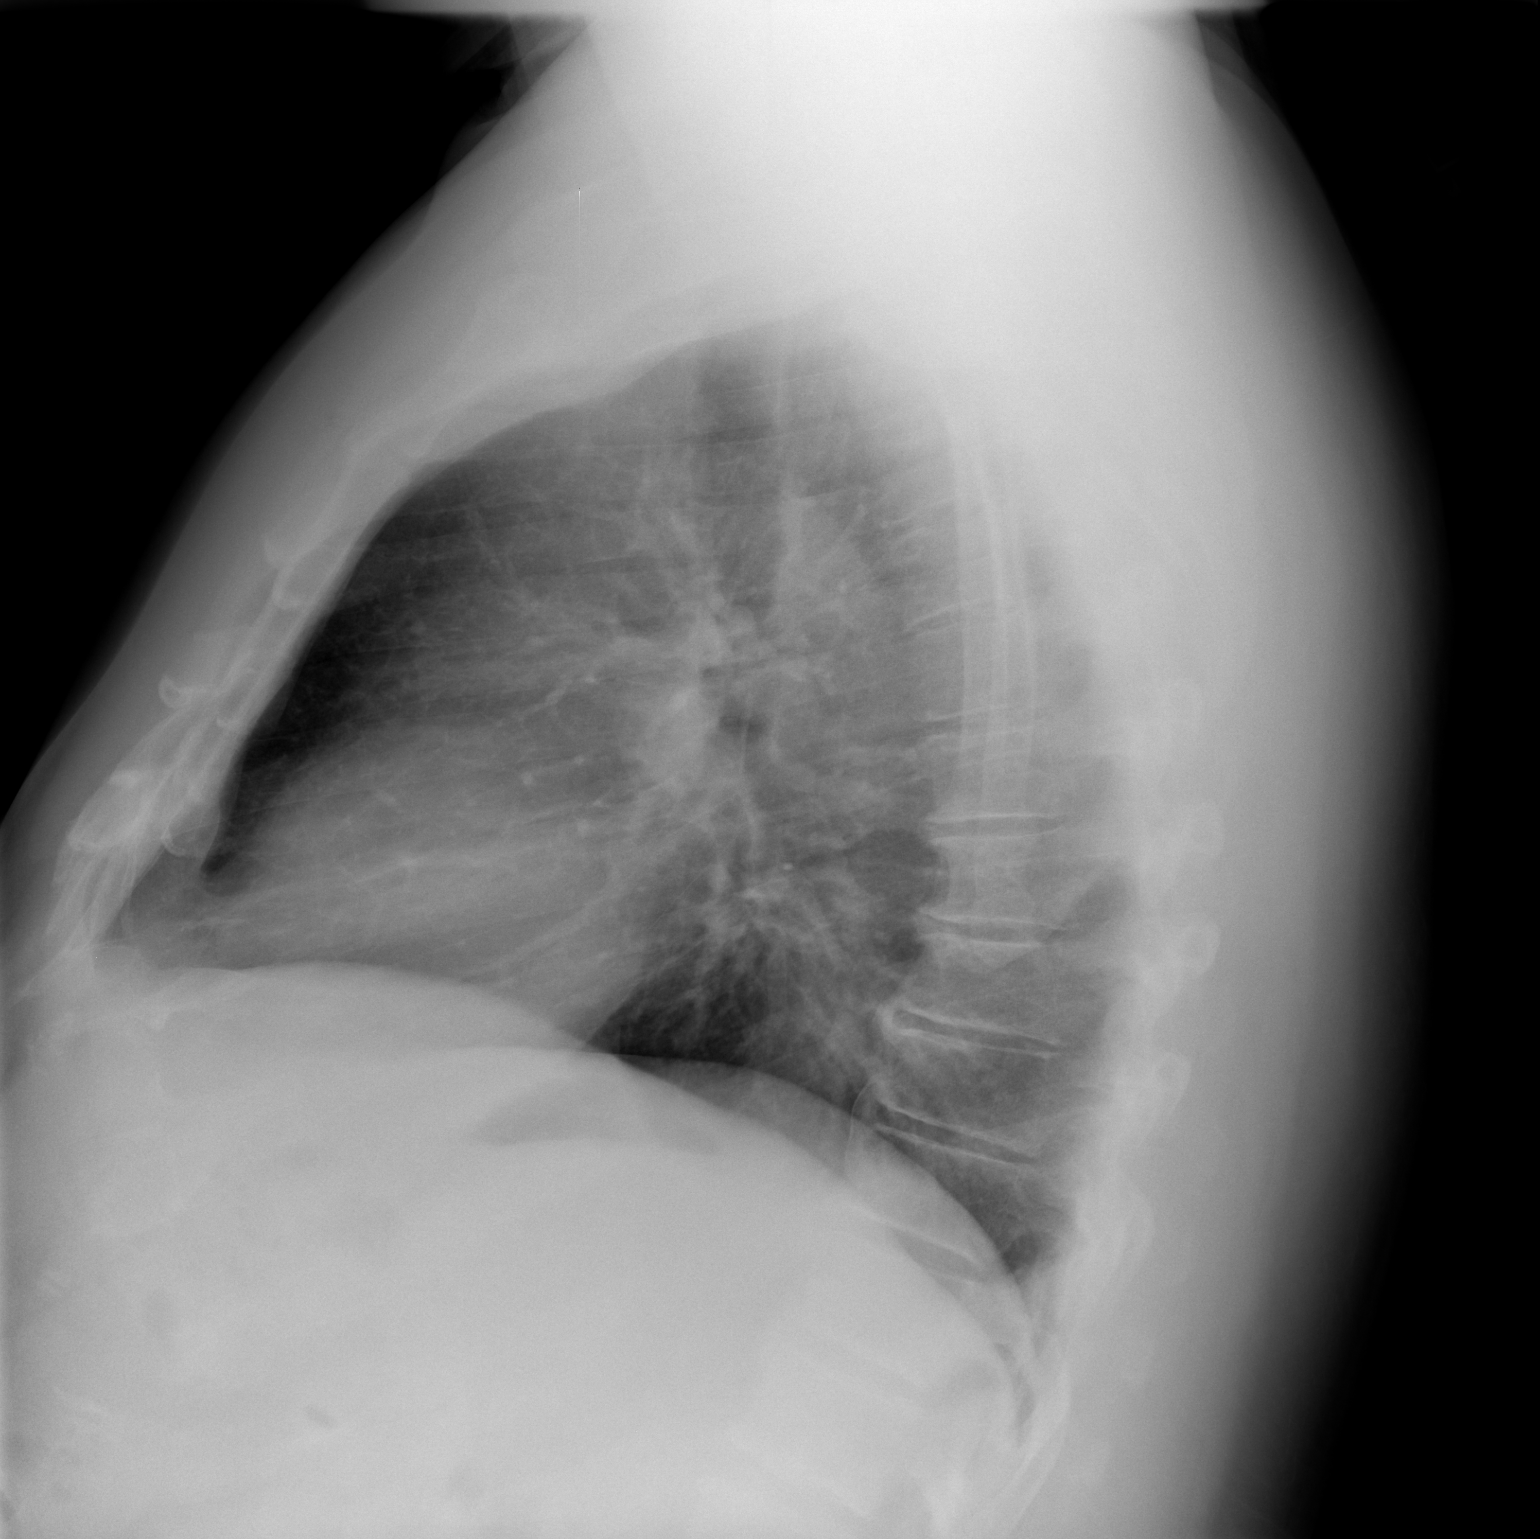

[2 of 2 positions shown; findings below may reference images not displayed]

FINDINGS: The lungs are clear.    There is some irregularity of the
mediastinal contours at and just below the level of the left main
pulmonary artery.  This could be due to overlapping vascular
structures and/or bony spurring, but comparison with prior or
follow-up chest x-ray is recommended.  The heart is within normal
limits in size.  No acute bony abnormality is seen.
IMPRESSION: No active lung disease.  Slightly irregular mediastinal contours on
the left of questionable significance.  Compare with prior or
follow-up chest x-ray versus CT of the chest.

## 2012-05-15 IMAGING — CR DG CHEST 2V
2 series · 2 of 2 positions shown · non-contrast
Comparison: 08/24/2009

CLINICAL DATA: Cough, shortness of breath and chest pain.

CHEST - 2 VIEW

[view not recorded (1 of 2)]
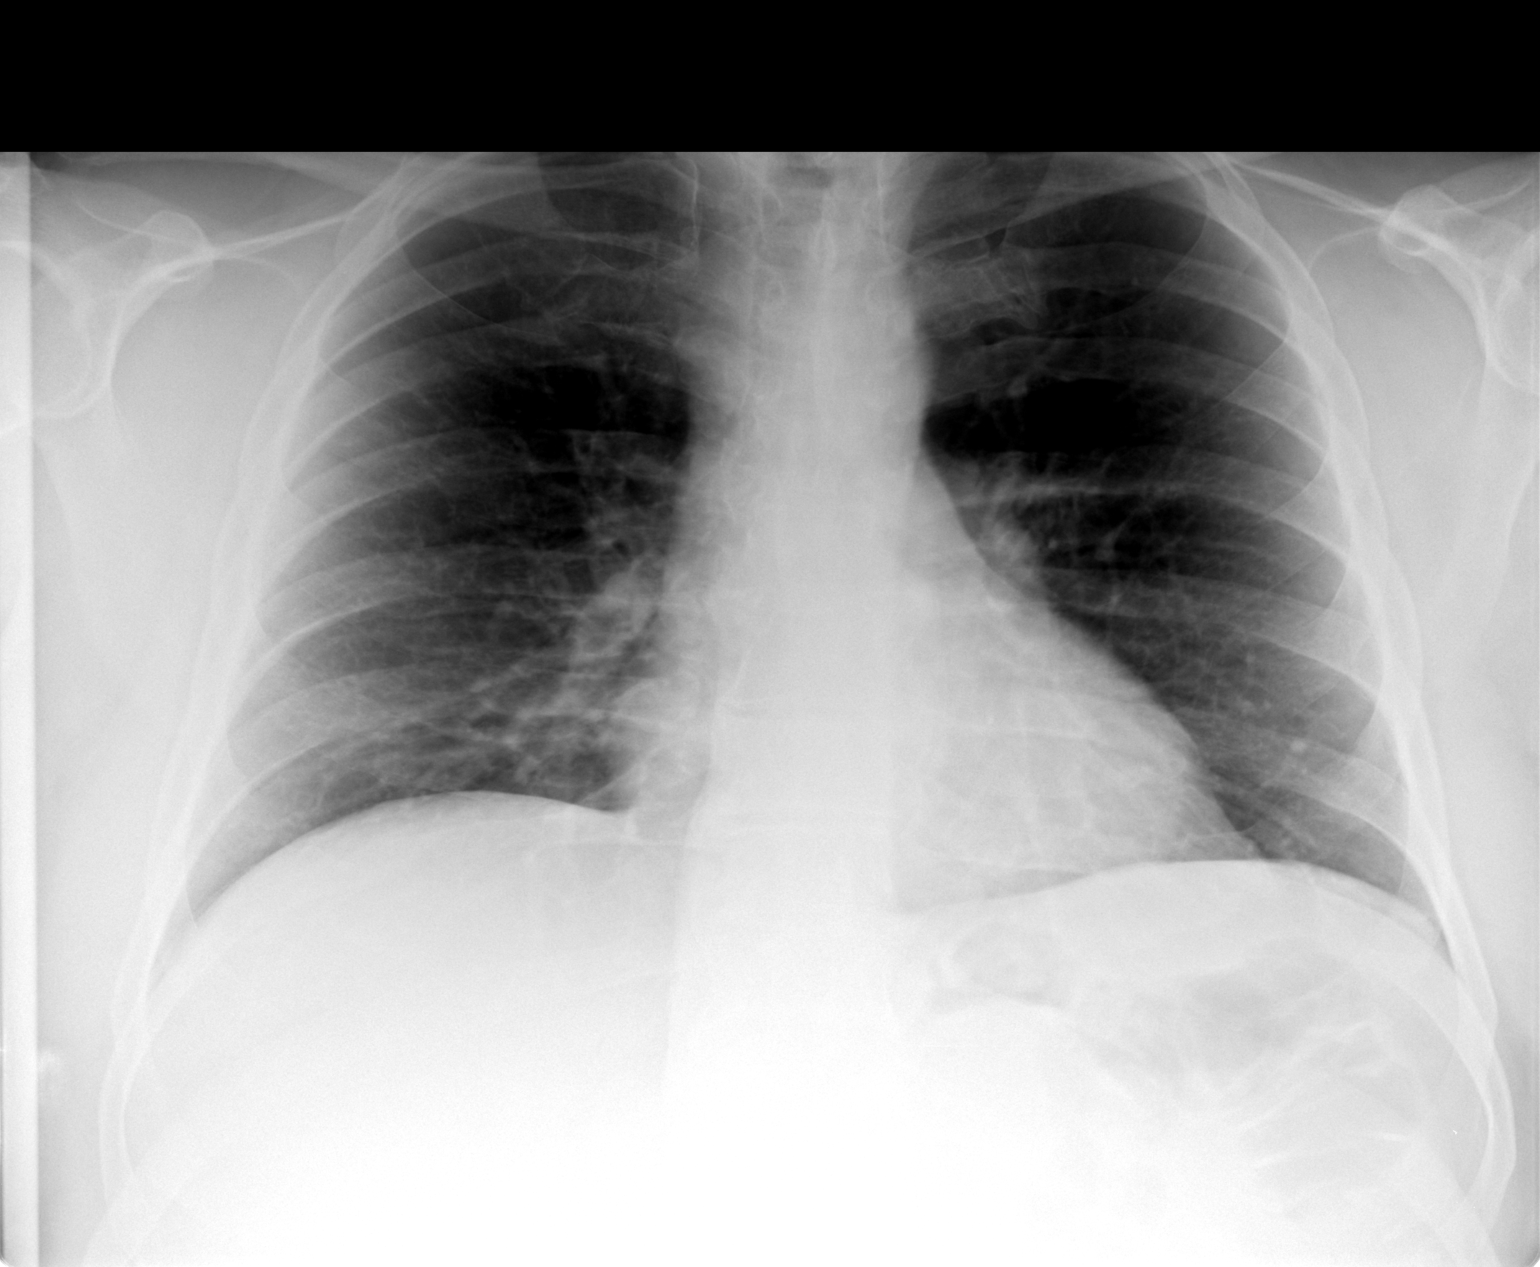

[view not recorded (2 of 2)]
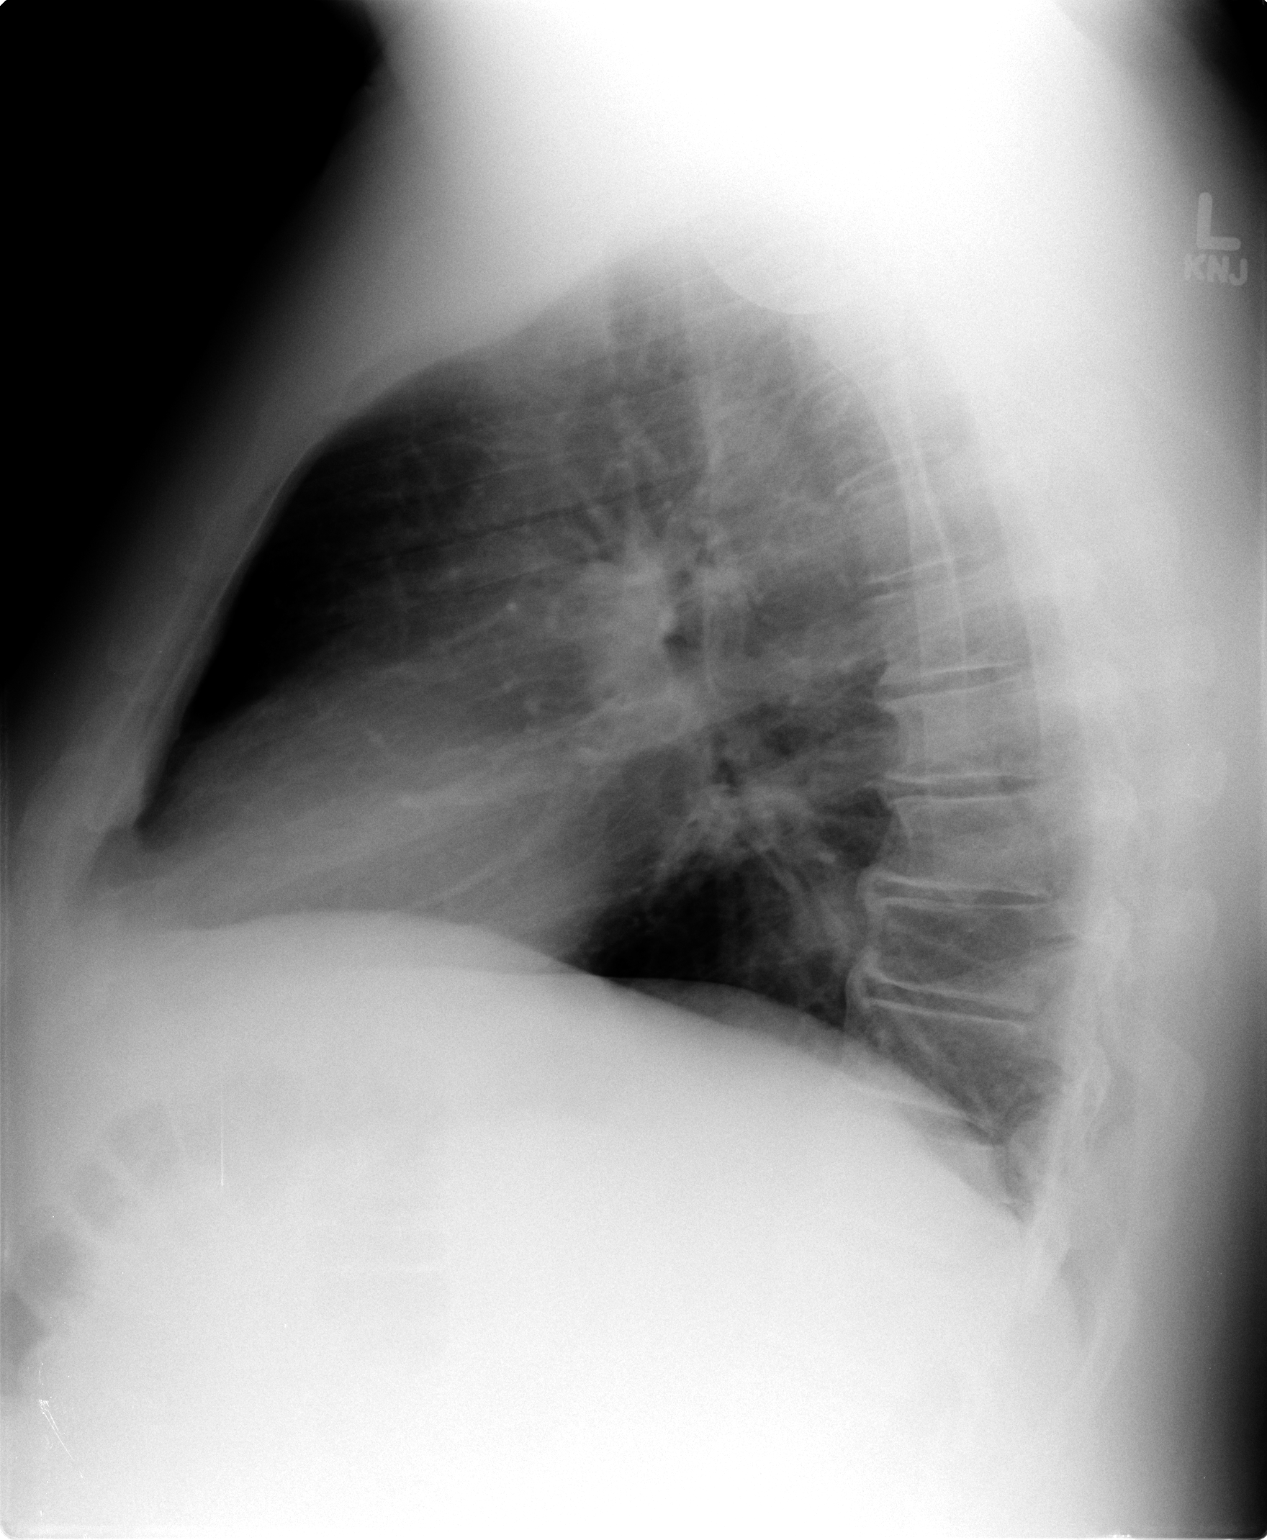

[2 of 2 positions shown; findings below may reference images not displayed]

FINDINGS: Trachea is midline.  Heart size normal.  Lungs are clear.
No pleural fluid.  Prominent osteophytes are seen in the spine.
IMPRESSION: No acute findings.

## 2012-06-18 ENCOUNTER — Ambulatory Visit (INDEPENDENT_AMBULATORY_CARE_PROVIDER_SITE_OTHER): Payer: BC Managed Care – PPO | Admitting: Pulmonary Disease

## 2012-06-18 ENCOUNTER — Encounter: Payer: Self-pay | Admitting: Pulmonary Disease

## 2012-06-18 VITALS — BP 152/88 | HR 113 | Temp 97.8°F | Ht 72.0 in | Wt 293.2 lb

## 2012-06-18 DIAGNOSIS — G4733 Obstructive sleep apnea (adult) (pediatric): Secondary | ICD-10-CM

## 2012-06-18 NOTE — Assessment & Plan Note (Signed)
The patient is wearing CPAP compliantly, and is having no tolerance issues.  He feels that he sleeps well with the device, and it does help his overall alertness during the day.  He continues to have some sleepiness issues during the day but I suspect it is related to insufficient sleep, possibly some chronic pain issues, as well as the possibility of some situational depression leading to insomnia.  I have asked him to stay on his CPAP religiously, work on weight loss and some type of exercise program, and to also work on his sleep hygiene.

## 2012-06-18 NOTE — Progress Notes (Signed)
  Subjective:    Patient ID: Frank Boyle, male    DOB: 1965-11-10, 47 y.o.   MRN: [id number]  HPI The pt comes in today for f/u of his known severe osa.  He is wearing cpap compliantly at optimal pressure, and feels he sleeps well when wearing the device.  He still has daytime sleepiness at times with inactivity, but admits that his sleep is fragmented secondary to some chronic pain, as well as some underlying emotional issues related to the death of his mother.  He is also napping some in the early evening which leads to sleep disruption at night.  The patient's weight is down 5 pounds since last visit.   Review of Systems  Constitutional: Negative for fever and unexpected weight change.  HENT: Negative for ear pain, nosebleeds, congestion, sore throat, rhinorrhea, sneezing, trouble swallowing, dental problem, postnasal drip and sinus pressure.   Eyes: Negative for redness and itching.  Respiratory: Negative for cough, chest tightness, shortness of breath and wheezing.   Cardiovascular: Negative for palpitations and leg swelling.  Gastrointestinal: Negative for nausea and vomiting.  Genitourinary: Negative for dysuria.  Musculoskeletal: Negative for joint swelling.  Skin: Negative for rash.  Neurological: Negative for headaches.  Hematological: Does not bruise/bleed easily.  Psychiatric/Behavioral: Negative for dysphoric mood. The patient is not nervous/anxious.        Objective:   Physical Exam Obese male in nad Nose without purulence or discharge noted. No skin breakdown or pressure necrosis from cpap mask Neck without LN or TMG LE without edema, no cyanosis Alert and oriented, moves all 4.        Assessment & Plan:

## 2012-06-18 NOTE — Patient Instructions (Addendum)
Continue to work on weight loss Keep up with mask changes and supplies Work on exercise program, and no sleeping during day or early evening. Please call if you continue to have respiratory issues. followup with me in one year.

## 2013-01-29 ENCOUNTER — Encounter: Payer: Self-pay | Admitting: Pulmonary Disease

## 2013-01-29 ENCOUNTER — Ambulatory Visit (INDEPENDENT_AMBULATORY_CARE_PROVIDER_SITE_OTHER): Payer: BC Managed Care – PPO | Admitting: Pulmonary Disease

## 2013-01-29 VITALS — BP 128/78 | HR 88 | Temp 98.7°F | Ht 72.0 in | Wt 288.6 lb

## 2013-01-29 DIAGNOSIS — G4733 Obstructive sleep apnea (adult) (pediatric): Secondary | ICD-10-CM

## 2013-01-29 NOTE — Assessment & Plan Note (Signed)
The patient is doing well with CPAP, but is having frequent awakenings for unknown reasons. There is nothing to suggest restless legs, although he does have some chronic pain issues. I have asked him to critically evaluate his environment, and see if there is something that may be affecting his sleep. I also asked him to consider sleeping in a different room for a few nights to see if the problem improves. This really does not sound like insomnia, since he has no issues with sleep onset or getting back to sleep once he awakens. We will also download off his CPAP device to see if that is working properly or if he is having mask leak issues.

## 2013-01-29 NOTE — Patient Instructions (Signed)
Will get a download off your machine for the last 3 mos to see if something going on with your machine. Critically assess your sleeping environment to make sure nothing there is affecting your sleep. Try sleeping in another room for a few nights to see if you do better.

## 2013-01-29 NOTE — Progress Notes (Signed)
   Subjective:    Patient ID: Frank Boyle, male    DOB: 02-Dec-1965, 48 y.o.   MRN: [id number]  HPI The patient comes in today for followup of his obstructive sleep apnea. He is been wearing his CPAP compliantly, and is having no issues with his mask fit or pressure. He has had for at least the last 3-6 months frequent awakenings at night for unknown reasons. He has no issues with sleep onset, and falls back asleep fairly rapidly once he does awaken during the night. He denies any environmental issues, but does state that his wife snores. He is on the automatic setting on his device, but he is unsure if his mask may be leaking.   Review of Systems  Constitutional: Negative for fever and unexpected weight change.  HENT: Negative for congestion, dental problem, ear pain, nosebleeds, postnasal drip, rhinorrhea, sinus pressure, sneezing, sore throat and trouble swallowing.   Eyes: Negative for redness and itching.  Respiratory: Negative for cough, chest tightness, shortness of breath and wheezing.   Cardiovascular: Negative for palpitations and leg swelling.  Gastrointestinal: Negative for nausea and vomiting.  Genitourinary: Negative for dysuria.  Musculoskeletal: Negative for joint swelling.  Skin: Negative for rash.  Neurological: Negative for headaches.  Hematological: Does not bruise/bleed easily.  Psychiatric/Behavioral: Positive for sleep disturbance. Negative for dysphoric mood. The patient is not nervous/anxious.        Objective:   Physical Exam Overweight male in no acute distress Nose without purulence or discharge noted No skin breakdown or pressure necrosis from the CPAP mask Neck without lymphadenopathy or thyromegaly Lower extremities without edema, no cyanosis Alert and oriented, moves all 4 extremities.       Assessment & Plan:

## 2013-03-07 ENCOUNTER — Institutional Professional Consult (permissible substitution): Payer: BC Managed Care – PPO | Admitting: Pulmonary Disease

## 2013-03-21 ENCOUNTER — Institutional Professional Consult (permissible substitution): Payer: BC Managed Care – PPO | Admitting: Pulmonary Disease

## 2013-06-18 ENCOUNTER — Encounter: Payer: Self-pay | Admitting: Pulmonary Disease

## 2013-06-18 ENCOUNTER — Ambulatory Visit (INDEPENDENT_AMBULATORY_CARE_PROVIDER_SITE_OTHER): Payer: BC Managed Care – PPO | Admitting: Pulmonary Disease

## 2013-06-18 VITALS — BP 120/80 | HR 81 | Temp 98.2°F | Ht 72.0 in | Wt 286.6 lb

## 2013-06-18 DIAGNOSIS — G4733 Obstructive sleep apnea (adult) (pediatric): Secondary | ICD-10-CM

## 2013-06-18 NOTE — Patient Instructions (Signed)
Continue with cpap, and keep up with mask changes and supplies. Work on weight loss followup with me again in one year.  

## 2013-06-18 NOTE — Progress Notes (Signed)
   Subjective:    Patient ID: Frank Boyle, male    DOB: February 24, 1965, 48 y.o.   MRN: [id number]  HPI The patient comes in today for followup of his obstructive sleep apnea. He is wearing CPAP compliantly, and has no issues with his mask fit or pressure. He is still having awakenings during the night, and has done and environmental review. He denies any history consistent with the restless leg syndrome or periodic limb movements. He does have a tendency to watch his clock during the night, and I've asked him to try removing it from the room or turning it around.   Review of Systems  Constitutional: Negative for fever and unexpected weight change.  HENT: Negative for congestion, dental problem, ear pain, nosebleeds, postnasal drip, rhinorrhea, sinus pressure, sneezing, sore throat and trouble swallowing.   Eyes: Negative for redness and itching.  Respiratory: Negative for cough, chest tightness, shortness of breath and wheezing.   Cardiovascular: Negative for palpitations and leg swelling.  Gastrointestinal: Negative for nausea and vomiting.  Genitourinary: Negative for dysuria.  Musculoskeletal: Negative for joint swelling.  Skin: Negative for rash.  Neurological: Negative for headaches.  Hematological: Does not bruise/bleed easily.  Psychiatric/Behavioral: Negative for dysphoric mood. The patient is not nervous/anxious.        Objective:   Physical Exam Overweight male in no acute distress Nose without purulence or discharge noted Neck without lymphadenopathy or thyromegaly No skin breakdown or pressure necrosis from the CPAP mask Lower extremities without edema, no cyanosis Alert and oriented, does not appear to be sleepy, moves all 4 extremities.       Assessment & Plan:

## 2013-06-18 NOTE — Assessment & Plan Note (Signed)
The patient is doing very well from a sleep apnea standpoint. His download from the end of last year shows excellent control of his events and no significant mask leak. The patient does have some symptoms during the day because of his awakenings at night, but it does not seem to be related to a sleep disorder. I've asked him to continue looking at his sleeping environment, and consider removing his alarm clock from the room. I've also asked him to work aggressively on weight loss.

## 2013-08-25 ENCOUNTER — Telehealth: Payer: Self-pay | Admitting: Pulmonary Disease

## 2013-08-25 NOTE — Telephone Encounter (Signed)
This is CMN. Please advise alida thanks

## 2013-08-28 NOTE — Telephone Encounter (Signed)
I do have cmn, [facility], and informed it is in Dr. Shelle Ironlance to be signed folder, he signs once a month and will be signing week of the 15th of August, once signed I will fax back. Kandice Hams.Kaleesi Guyton

## 2014-06-19 ENCOUNTER — Ambulatory Visit: Payer: BC Managed Care – PPO | Admitting: Pulmonary Disease

## 2015-02-10 ENCOUNTER — Telehealth: Payer: Self-pay | Admitting: Pulmonary Disease

## 2015-02-10 NOTE — Telephone Encounter (Signed)
Spoke with pt, states he needs a new cpap-machine is "taped up to keep working" per pt.  Pt last seen by [facility] in 2015.  Pt scheduled for Friday at 11:30 with RA.  Nothing further needed.

## 2015-02-12 ENCOUNTER — Telehealth: Payer: Self-pay | Admitting: Pulmonary Disease

## 2015-02-12 ENCOUNTER — Ambulatory Visit (INDEPENDENT_AMBULATORY_CARE_PROVIDER_SITE_OTHER): Payer: BC Managed Care – PPO | Admitting: Pulmonary Disease

## 2015-02-12 ENCOUNTER — Encounter: Payer: Self-pay | Admitting: Pulmonary Disease

## 2015-02-12 VITALS — BP 110/72 | HR 76 | Ht 73.0 in | Wt 250.0 lb

## 2015-02-12 DIAGNOSIS — G4733 Obstructive sleep apnea (adult) (pediatric): Secondary | ICD-10-CM

## 2015-02-12 DIAGNOSIS — Z9989 Dependence on other enabling machines and devices: Principal | ICD-10-CM

## 2015-02-12 NOTE — Patient Instructions (Signed)
Congratulations on your wt loss We will consider repeat sleep study next visit Set you up with local DME - new humidifier CPAP supplies will be renewed x 1 yr Your CPAP is set at 14 cm

## 2015-02-12 NOTE — Progress Notes (Signed)
   Subjective:    Patient ID: Frank Boyle, male    DOB: [birth year], 50 y.o.   MRN: [id number]  HPI  Chief Complaint  Patient presents with  . Follow-up    Water chamber on the front of CPAP and mask are broken. DME: [facility]. Pt needs new supplies. Going out of the country next week needing machine and supplies as soon as possible even if that means getting a new DME company. Still using machine but only getting air out of it which is causing dryness for the pt with occasional sore throat in the mornings.    For follow-up of severe OSA. He has been maintained on CPAP of 14 cm He needs a new humidifier urgently- Complains of mouth and nasal dryness Pressure is okay and mask is broken - needs new 1 His DME is [facility] And he is afraid they will not be able to get it to him by next week. He plans to travel to [place].  He has lost close to 40 pounds since his original study  Download 01/2015 >> 14 cm, 6.5 h  I have Obtained and reviewed Sleep studies and download reports  Significant tests/ events  NPSG 2002: AHI 125/hr.  Auto 2013: optimal pressure 14-15cm  Past Medical History  Diagnosis Date  . OSA (obstructive sleep apnea)     PFT's,NI spirometry August 17,2011.Marland KitchenMarland KitchenHFA 50 > 90%  . Abnormal CXR     Mediastinal contour irregular 08/24/09 > not obvious on f/u cxr 12/08/2009    Review of Systems neg for any significant sore throat, dysphagia, itching, sneezing, nasal congestion or excess/ purulent secretions, fever, chills, sweats, unintended wt loss, pleuritic or exertional cp, hempoptysis, orthopnea pnd or change in chronic leg swelling. Also denies presyncope, palpitations, heartburn, abdominal pain, nausea, vomiting, diarrhea or change in bowel or urinary habits, dysuria,hematuria, rash, arthralgias, visual complaints, headache, numbness weakness or ataxia.     Objective:   Physical Exam  Gen. Pleasant, obese, in no distress ENT - no lesions, no  post nasal drip Neck: No JVD, no thyromegaly, no carotid bruits Lungs: no use of accessory muscles, no dullness to percussion, decreased without rales or rhonchi  Cardiovascular: Rhythm regular, heart sounds  normal, no murmurs or gallops, no peripheral edema Musculoskeletal: No deformities, no cyanosis or clubbing , no tremors        Assessment & Plan:

## 2015-02-12 NOTE — Assessment & Plan Note (Signed)
Congratulations on your wt loss We will consider repeat sleep study next visit Set you up with local DME - new humidifier CPAP supplies will be renewed x 1 yr Your CPAP is set at 14 cm  Weight loss encouraged, compliance with goal of at least 4-6 hrs every night is the expectation. Advised against medications with sedative side effects Cautioned against driving when sleepy - understanding that sleepiness will vary on a day to day basis

## 2015-02-12 NOTE — Telephone Encounter (Signed)
Spoke with pt. States that he spoke with [facility]. According to them, they can't replace the pt's humidifier due to the current model he has can't be reordered. Pt will need an order for a new CPAP machine. Order will be placed. Nothing further was needed.

## 2015-03-16 ENCOUNTER — Telehealth: Payer: Self-pay | Admitting: Pulmonary Disease

## 2015-03-16 NOTE — Telephone Encounter (Signed)
Spoke with pt. He reports [facility] advised him they still have not received CMN back from our office that Dr. Vassie Loll needs to sign. Synetta Fail, have you received this?

## 2015-03-16 NOTE — Telephone Encounter (Signed)
I have received 2 copies today but Dr. Vassie Loll is not here all week. He will not be in this office until Monday

## 2015-03-22 NOTE — Telephone Encounter (Signed)
Synetta Fail, did you put these order forms in a blue folder for Dr. Vassie Loll?  He will be here this afternoon.

## 2015-03-22 NOTE — Telephone Encounter (Signed)
Faxed signed form to [facility] [phone]

## 2015-03-24 ENCOUNTER — Telehealth: Payer: Self-pay | Admitting: Pulmonary Disease

## 2015-03-24 NOTE — Telephone Encounter (Signed)
Spoke with pt, states that [facility] still does not have pt's cpap order.  This was originally ordered on 02/12/15, and was refaxed on 03/22/15 per Anita's documentation.  Spoke with Alecia Lemming at [facility], states that they still have not received signed order from RA- this order is being refaxed to our office to be resigned.  Will await fax.

## 2015-03-25 NOTE — Telephone Encounter (Signed)
I called [facility] and spoke with Dorathy Daft she verified that they received the form on 02/28 and I ask if they needed anything else from our office to help get the patient his supplies. She stated she didn't think so. I called Frank Boyle and let him know that I verified they had received the form and they should be getting in touch with him

## 2015-03-25 NOTE — Telephone Encounter (Signed)
Checked RA's look at and up front. This form is not there. Marcelino Duster do you have this form?

## 2015-03-25 NOTE — Telephone Encounter (Signed)
Per Anita's post:    Frank Boyle at 03/22/2015 5:04 PM     Status: Signed       Expand All Collapse All   Faxed signed form to [facility] [phone]        ----------- Synetta Fail, can you follow up on this and let me know if there is anything I need to do? Thanks.

## 2015-04-13 ENCOUNTER — Telehealth: Payer: Self-pay | Admitting: Pulmonary Disease

## 2015-04-13 NOTE — Telephone Encounter (Signed)
Spoke with pt, states that he received word from [facility] that his cpap is being delivered to him, but cpap is preset at a pressure of 7.  Pt states he didn't believe his pressure should be that low.  Per pt's last ov note, pt's pressure is to be set at 14.   Called [facility], states that pt's pressure is set at 7, and that a corrected rx would need to be signed to fix this.  Form is being faxed to our verified fax #.  Will hold message until form is received.   Pt aware of above.

## 2015-04-14 NOTE — Telephone Encounter (Signed)
[facility]

## 2015-04-15 ENCOUNTER — Telehealth: Payer: Self-pay | Admitting: Pulmonary Disease

## 2015-04-15 DIAGNOSIS — G4733 Obstructive sleep apnea (adult) (pediatric): Secondary | ICD-10-CM

## 2015-04-15 NOTE — Telephone Encounter (Signed)
Spoke with pt. He needs his CPAP pressure changed to 14. [facility] sent him his new CPAP machine set at 7 instead of 14. Pt needs us to send an order to [facility] in [place] to have this changed. Order will be placed. Nothing further was needed.

## 2015-05-24 ENCOUNTER — Ambulatory Visit (INDEPENDENT_AMBULATORY_CARE_PROVIDER_SITE_OTHER): Payer: BC Managed Care – PPO | Admitting: Pulmonary Disease

## 2015-05-24 ENCOUNTER — Encounter (INDEPENDENT_AMBULATORY_CARE_PROVIDER_SITE_OTHER): Payer: Self-pay

## 2015-05-24 DIAGNOSIS — G4733 Obstructive sleep apnea (adult) (pediatric): Secondary | ICD-10-CM

## 2015-05-25 NOTE — Progress Notes (Signed)
Appointment scheduled in error.

## 2016-02-14 ENCOUNTER — Ambulatory Visit: Payer: BC Managed Care – PPO | Admitting: Adult Health

## 2024-01-31 ENCOUNTER — Telehealth: Payer: Self-pay | Admitting: Pulmonary Disease

## 2024-01-31 NOTE — Telephone Encounter (Signed)
 Copied from CRM #[id number]. Topic: General - Other >> Jan 31, 2024  8:53 AM Corean SAUNDERS wrote: Reason for CRM: [facility] is requesting the patients imaging report be re faxed to them at [phone]  Patient has not been seen here in years. Not sure which imaging they are referring to.

## 2024-02-01 NOTE — Telephone Encounter (Signed)
 LOV was with Dr. Shellia on 05/24/2015.  Imaging is from 2011/2012.  We only saw patient for sleep apnea.  Patient is currently being seen at [facility], LOV 02/22/2023.  They will need to contact them.  No phone number was left to return call so I am unable to return call.  If they call back provide them with this phone number for new provider, [facility]) Frank Na MD [phone].  Nothing further needed.
